# Patient Record
Sex: Male | Born: 1940 | Race: White | Hispanic: No | Marital: Single | Smoking: Never smoker
Health system: Southern US, Community
[De-identification: ages and names within clinical notes are randomized; demographics above are authoritative.]

## PROBLEM LIST (undated history)

## (undated) DIAGNOSIS — R011 Cardiac murmur, unspecified: Secondary | ICD-10-CM

## (undated) DIAGNOSIS — I509 Heart failure, unspecified: Secondary | ICD-10-CM

---

## 2017-05-21 ENCOUNTER — Emergency Department
Admission: EM | Admit: 2017-05-21 | Discharge: 2017-05-21 | Disposition: A | Discharge status: Skilled Nursing Facility | Payer: Medicare Other | Attending: Emergency Medicine | Admitting: Emergency Medicine

## 2017-05-21 ENCOUNTER — Encounter: Payer: Self-pay | Admitting: Emergency Medicine

## 2017-05-21 DIAGNOSIS — Z466 Encounter for fitting and adjustment of urinary device: Secondary | ICD-10-CM | POA: Diagnosis present

## 2017-05-21 DIAGNOSIS — Z960 Presence of urogenital implants: Secondary | ICD-10-CM | POA: Insufficient documentation

## 2017-05-21 DIAGNOSIS — R339 Retention of urine, unspecified: Secondary | ICD-10-CM | POA: Diagnosis not present

## 2017-05-21 HISTORY — DX: Heart failure, unspecified: I50.9

## 2017-05-21 HISTORY — DX: Cardiac murmur, unspecified: R01.1

## 2017-05-21 LAB — URINALYSIS, ROUTINE W REFLEX MICROSCOPIC
SQUAMOUS EPITHELIAL / LPF: NONE SEEN
Specific Gravity, Urine: 1.012 (ref 1.005–1.030)

## 2017-05-21 MED ORDER — CEPHALEXIN 250 MG PO CAPS: 250.0000 mg | ORAL_CAPSULE | Freq: Four times a day (QID) | ORAL | 0 refills | Status: AC

## 2017-05-21 NOTE — ED Triage Notes (Signed)
Patient is resident at [facility] and came via EMS d/t patient's foley sustaining trauma (nearly pulled out, then totally deflated and removed by staff RN there) around 11am today.  They were unable to replace catheter, and sought ED care.  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  Caregiver states there has been new pink bleeding since this event.

## 2017-05-21 NOTE — ED Provider Notes (Addendum)
[facility] Emergency Department Provider Note       Time seen: ----------------------------------------- 6:41 PM on 05/21/2017 -----------------------------------------   I have reviewed the triage vital signs and the nursing notes.   HISTORY   Chief Complaint Foley Catheter Displacement    HPI Carl Jackson is a 76 y.o. male who presents to the ED for Foley catheter dislodgment. Information is given through his sister. 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 Patient had inadvertently partially removed his Foley catheter today and a nurse subsequently removed it entirely. There was an attempt to replace the tube which was unsuccessful.   No past medical history on file.  There are no active problems to display for this patient.   No past surgical history on file.  Allergies Patient has no allergy information on record.  Social History Social History  Substance Use Topics  . Smoking status: Not on file  . Smokeless tobacco: Not on file  . Alcohol use Not on file    Review of Systems Constitutional: Negative for fever. Respiratory: Negative for shortness of breath. Gastrointestinal: Negative for vomiting and diarrhea. Genitourinary: Positive for urinary retention Skin: Negative for rash.  All systems negative/normal/unremarkable except as stated in the HPI  ____________________________________________   PHYSICAL EXAM:  VITAL SIGNS: ED Triage Vitals  Enc Vitals Group     BP      Pulse      Resp      Temp      Temp src      SpO2      Weight      Height      Head Circumference      Peak Flow      Pain Score      Pain Loc      Pain Edu?      Excl. in GC?     Constitutional: Well appearing and in no distress. Eyes: Conjunctivae are normal. Normal extraocular movements. Cardiovascular: Normal rate, regular rhythm. No murmurs, rubs, or gallops.Heart systolic  murmur noted Respiratory: Normal respiratory effort without tachypnea nor retractions. Breath sounds are clear and equal bilaterally. No wheezes/rales/rhonchi. Gastrointestinal: Soft and nontender. 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 Circumcised, scrotum is unremarkable Musculoskeletal: Nontender with normal range of motion in extremities. No lower extremity tenderness nor edema. Neurologic:  Normal speech and language. No gross focal neurologic deficits are appreciated.  Skin:  Skin is warm, dry and intact. No rash noted. ___________________________________________  ED COURSE:  Pertinent labs & imaging results that were available during my care of the patient were reviewed by me and considered in my medical decision making (see chart for details). Patient presents for Foley catheter dislodgment, we will attempt to replace Foley catheter and check a urinalysis. Clinical Course as of May 21 1954  Sat May 21, 2017  1956 16 French coud catheter was placed by me with some difficulty but with blood-tinged urine return. This was clearing at the time of discharge.  [JW]    Clinical Course User Index [JW] Emily Filbert, MD   Procedures ____________________________________________   LABS (pertinent positives/negatives)  Labs Reviewed  URINE CULTURE  URINALYSIS, ROUTINE W REFLEX MICROSCOPIC   ____________________________________________  FINAL ASSESSMENT AND PLAN  Foley catheter replacement  Plan: Patient's labs were dictated above. Patient had presented for Foley catheter replacement which was successful. I have seen urine culture, I will place him on preventative  antibiotics due to his history. He is stable for outpatient follow-up.   Emily FilbertWilliams, Carl Issa E, MD   Note: This note was generated in part or whole with voice recognition software. Voice recognition is usually quite accurate but there are transcription  errors that can and very often do occur. I apologize for any typographical errors that were not detected and corrected.     Emily FilbertWilliams, Carl Males E, MD 05/21/17 1956    Emily FilbertWilliams, Carl Madariaga E, MD 05/21/17 [phone]

## 2017-05-21 NOTE — ED Notes (Signed)
[facility] has been called to transport patient back to [facility] in [place].

## 2017-05-24 LAB — URINE CULTURE
Culture: >=100000 — AB
SPECIAL REQUESTS: NORMAL

## 2017-05-25 NOTE — Progress Notes (Signed)
[name] from [facility] seen in ED for foley catheter insertion after catheter was almost pulled by the patient at the assisted living facility then nursing staff deflated and attempted to reinsert without success. Patient was d/c from ED with prophylactic cephalexin. Urine culture was collected in the ED and subsequently resulted with E faecalis. Spoke to nurse at [facility] and faxed cutlure results. Nurse will f/u with Dr. Quillian QuinceBliss.   Carl Jackson, PharmD Clinical Pharmacist

## 2017-12-23 ENCOUNTER — Emergency Department: Payer: Medicare Other

## 2017-12-23 ENCOUNTER — Encounter: Payer: Self-pay | Admitting: Emergency Medicine

## 2017-12-23 ENCOUNTER — Other Ambulatory Visit: Payer: Self-pay

## 2017-12-23 ENCOUNTER — Emergency Department
Admission: EM | Admit: 2017-12-23 | Discharge: 2017-12-24 | Disposition: A | Discharge status: Skilled Nursing Facility | Payer: Medicare Other | Attending: Emergency Medicine | Admitting: Emergency Medicine

## 2017-12-23 DIAGNOSIS — I509 Heart failure, unspecified: Secondary | ICD-10-CM | POA: Insufficient documentation

## 2017-12-23 DIAGNOSIS — F79 Unspecified intellectual disabilities: Secondary | ICD-10-CM | POA: Diagnosis not present

## 2017-12-23 DIAGNOSIS — Y999 Unspecified external cause status: Secondary | ICD-10-CM | POA: Diagnosis not present

## 2017-12-23 DIAGNOSIS — Y939 Activity, unspecified: Secondary | ICD-10-CM | POA: Insufficient documentation

## 2017-12-23 DIAGNOSIS — T148XXA Other injury of unspecified body region, initial encounter: Secondary | ICD-10-CM

## 2017-12-23 DIAGNOSIS — S0083XA Contusion of other part of head, initial encounter: Secondary | ICD-10-CM | POA: Diagnosis not present

## 2017-12-23 DIAGNOSIS — Y92129 Unspecified place in nursing home as the place of occurrence of the external cause: Secondary | ICD-10-CM | POA: Diagnosis not present

## 2017-12-23 DIAGNOSIS — W19XXXA Unspecified fall, initial encounter: Secondary | ICD-10-CM | POA: Insufficient documentation

## 2017-12-23 DIAGNOSIS — S0990XA Unspecified injury of head, initial encounter: Secondary | ICD-10-CM | POA: Diagnosis present

## 2017-12-23 NOTE — ED Notes (Signed)
Pt's sister Avon GullyHCPOA [phone].

## 2017-12-23 NOTE — ED Provider Notes (Signed)
[facility] Emergency Department Provider Note  Time seen: 10:20 PM  I have reviewed the triage vital signs and the nursing notes.   HISTORY  Chief Complaint Fall    HPI Carl Jackson is a 77 y.o. male with a past medical history of CHF, mental retardation, lives at a nursing facility presents to the emergency department after a fall.  According to report patient had a fall at his nursing facility.  It was not reported that anyone witnessed the fall.  Patient was noted to have significant swelling and bruising above his left eye so he was transported to the emergency department for further evaluation.  Patient has a history of mental retardation, denies any pain, denies any complaints but largely cannot contribute to his history at this time.   Past Medical History:  Diagnosis Date  . CHF (congestive heart failure) (HCC)   . Heart murmur     There are no active problems to display for this patient.   History reviewed. No pertinent surgical history.  Prior to Admission medications   Not on File    No Known Allergies  No family history on file.  Social History Social History   Tobacco Use  . Smoking status: Never Smoker  . Smokeless tobacco: Never Used  Substance Use Topics  . Alcohol use: No  . Drug use: No    Review of Systems Unable to obtain an adequate/accurate review of systems due to mental retardation.  ____________________________________________   PHYSICAL EXAM:  VITAL SIGNS: ED Triage Vitals  Enc Vitals Group     BP 12/23/17 2154 140/86     Pulse Rate 12/23/17 2154 93     Resp 12/23/17 2154 (!) 24     Temp 12/23/17 2154 98.4 F (36.9 C)     Temp Source 12/23/17 2154 Oral     SpO2 12/23/17 2154 99 %     Weight 12/23/17 2158 120 lb (54.4 kg)     Height 12/23/17 2158 5\' 6"  (1.676 m)     Head Circumference --      Peak Flow --      Pain Score --      Pain Loc --      Pain Edu? --      Excl. in GC? --     Constitutional: Alert, no distress, calm.  Follows commands.  Answers some questions but with questionable accuracy. Eyes: Moderate hematoma with ecchymosis to the left eyebrow with periorbital edema and ecchymosis unable to open the left eye without manually lifting the lid. ENT   Head: Patient has a moderate hematoma with ecchymosis above the left eye, mild ecchymosis above the right eye.  I was able to manually lift the left eyelid, extraocular's are intact, pupil appears responsive.   Nose: No blood in the nostrils or septal hematoma   Mouth/Throat: Mucous membranes are moist. Cardiovascular: Normal rate, regular rhythm Respiratory: Normal respiratory effort without tachypnea nor retractions. Breath sounds are clear  Gastrointestinal: Soft and nontender. No distention.   Musculoskeletal: Nontender with normal range of motion in all extremities.  Good range of motion in all extremities.  Mild abrasion to right knee Neurologic:  Normal speech and language. No gross focal neurologic deficits  Skin:  Skin is warm.  Mild abrasion to the lateral aspect of the right knee Psychiatric: Mood and affect are normal.   ____________________________________________    EKG  EKG reviewed and interpreted by myself shows sinus rhythm at 95 bpm with a  slightly widened QRS, normal axis, nonspecific ST changes.  Occasional PVCs.  ____________________________________________    RADIOLOGY  CT scans are negative for fracture, intracranial abnormality or cervical spine fracture.  I do note possible interstitial edema patient has no complaints of shortness of breath, satting 99% on room air.  ____________________________________________   INITIAL IMPRESSION / ASSESSMENT AND PLAN / ED COURSE  Pertinent labs & imaging results that were available during my care of the patient were reviewed by me and considered in my medical decision making (see chart for details).  Patient presents to the  emergency department from his nursing facility after a fall.  He does have a hematoma above his left eye with mild bruising of the right eye.  Extraocular does appear intact.  Overall the patient appears well, no distress.  History of MR but appears calm, cooperative and pleasant.  We will obtain CT imaging of the head face and C-spine as a precaution.  On MAR the patient does not appear to be on any anticoagulation, besides an 81 mg aspirin daily.  Patient has a DNR form.  CT scans are largely negative besides soft tissue swelling/hematoma.  Patient will be discharged home.  ____________________________________________   FINAL CLINICAL IMPRESSION(S) / ED DIAGNOSES  Fall Hematoma    Minna Antis, MD 12/23/17 [phone]

## 2017-12-23 NOTE — ED Triage Notes (Signed)
Pt arrives via [facility] with c/o fall. Pt arrives from [facility] and has has contusion noted to both eyes. Pt takes baby aspirin but is otherwise not on anticoagulants. Pt has hx of MR but denies LOC. No other bruising noted at this time. Pt is in NAD.

## 2017-12-23 NOTE — ED Notes (Signed)
Pt given yellow socks and fall pads as well as a bed alarm.

## 2017-12-23 NOTE — ED Notes (Signed)
[facility] called for transport to [facility]

## 2017-12-24 ENCOUNTER — Observation Stay
Admission: EM | Admit: 2017-12-24 | Discharge: 2017-12-25 | Disposition: A | Discharge status: Home / Self Care | Payer: Medicare Other | Attending: Internal Medicine | Admitting: Internal Medicine

## 2017-12-24 ENCOUNTER — Encounter: Payer: Self-pay | Admitting: Emergency Medicine

## 2017-12-24 ENCOUNTER — Other Ambulatory Visit: Payer: Self-pay

## 2017-12-24 ENCOUNTER — Emergency Department: Payer: Medicare Other

## 2017-12-24 DIAGNOSIS — F79 Unspecified intellectual disabilities: Secondary | ICD-10-CM | POA: Insufficient documentation

## 2017-12-24 DIAGNOSIS — N400 Enlarged prostate without lower urinary tract symptoms: Secondary | ICD-10-CM | POA: Diagnosis not present

## 2017-12-24 DIAGNOSIS — I509 Heart failure, unspecified: Secondary | ICD-10-CM | POA: Diagnosis not present

## 2017-12-24 DIAGNOSIS — R748 Abnormal levels of other serum enzymes: Secondary | ICD-10-CM | POA: Diagnosis present

## 2017-12-24 DIAGNOSIS — I110 Hypertensive heart disease with heart failure: Secondary | ICD-10-CM | POA: Diagnosis not present

## 2017-12-24 DIAGNOSIS — R55 Syncope and collapse: Principal | ICD-10-CM | POA: Insufficient documentation

## 2017-12-24 DIAGNOSIS — Z7982 Long term (current) use of aspirin: Secondary | ICD-10-CM | POA: Insufficient documentation

## 2017-12-24 DIAGNOSIS — R778 Other specified abnormalities of plasma proteins: Secondary | ICD-10-CM | POA: Diagnosis present

## 2017-12-24 DIAGNOSIS — Z79899 Other long term (current) drug therapy: Secondary | ICD-10-CM | POA: Diagnosis not present

## 2017-12-24 DIAGNOSIS — Z66 Do not resuscitate: Secondary | ICD-10-CM | POA: Insufficient documentation

## 2017-12-24 DIAGNOSIS — R7989 Other specified abnormal findings of blood chemistry: Secondary | ICD-10-CM

## 2017-12-24 DIAGNOSIS — W19XXXA Unspecified fall, initial encounter: Secondary | ICD-10-CM

## 2017-12-24 LAB — BASIC METABOLIC PANEL
Anion gap: 10 (ref 5–15)
BUN: 23 mg/dL — AB (ref 6–20)
CALCIUM: 9.5 mg/dL (ref 8.9–10.3)
CHLORIDE: 103 mmol/L (ref 101–111)
CO2: 28 mmol/L (ref 22–32)
CREATININE: 1.13 mg/dL (ref 0.61–1.24)
GFR calc Af Amer: >60 mL/min (ref >60)
Glucose, Bld: 150 mg/dL — ABNORMAL HIGH (ref 65–99)
Potassium: 3.9 mmol/L (ref 3.5–5.1)
SODIUM: 141 mmol/L (ref 135–145)

## 2017-12-24 LAB — CBC WITH DIFFERENTIAL/PLATELET
BASOS PCT: 1 %
Basophils Absolute: 0.1 10*3/uL (ref 0–0.1)
EOS ABS: 0.1 10*3/uL (ref 0–0.7)
EOS PCT: 1 %
HCT: 40.6 % (ref 40.0–52.0)
HEMOGLOBIN: 13.7 g/dL (ref 13.0–18.0)
LYMPHS ABS: 0.9 10*3/uL — AB (ref 1.0–3.6)
Lymphocytes Relative: 14 %
MCH: 30.3 pg (ref 26.0–34.0)
MCHC: 33.7 g/dL (ref 32.0–36.0)
MCV: 89.7 fL (ref 80.0–100.0)
MONO ABS: 0.7 10*3/uL (ref 0.2–1.0)
MONOS PCT: 10 %
NEUTROS PCT: 74 %
Neutro Abs: 5.2 10*3/uL (ref 1.4–6.5)
PLATELETS: 278 10*3/uL (ref 150–440)
RBC: 4.52 MIL/uL (ref 4.40–5.90)
RDW: 13.1 % (ref 11.5–14.5)
WBC: 7 10*3/uL (ref 3.8–10.6)

## 2017-12-24 LAB — TROPONIN I
TROPONIN I: 0.18 ng/mL — AB (ref <0.03)
TROPONIN I: 0.18 ng/mL — AB (ref <0.03)
TROPONIN I: 0.21 ng/mL — AB (ref <0.03)
Troponin I: 0.21 ng/mL (ref <0.03)

## 2017-12-24 LAB — CK: CK TOTAL: 128 U/L (ref 49–397)

## 2017-12-24 MED ORDER — ENOXAPARIN SODIUM 40 MG/0.4ML ~~LOC~~ SOLN
40.0000 mg | SUBCUTANEOUS | Status: DC
  Administered 2017-12-24: 40 mg via SUBCUTANEOUS
  Filled 2017-12-24: qty 0.4

## 2017-12-24 MED ORDER — ONDANSETRON HCL 4 MG/2ML IJ SOLN: 4.0000 mg | Freq: Four times a day (QID) | INTRAMUSCULAR | Status: DC | PRN

## 2017-12-24 MED ORDER — TAMSULOSIN HCL 0.4 MG PO CAPS
0.8000 mg | ORAL_CAPSULE | Freq: Every day | ORAL | Status: DC
  Administered 2017-12-24: 0.8 mg via ORAL
  Filled 2017-12-24: qty 2

## 2017-12-24 MED ORDER — ASPIRIN EC 81 MG PO TBEC
81.0000 mg | DELAYED_RELEASE_TABLET | Freq: Every day | ORAL | Status: DC
  Administered 2017-12-24 – 2017-12-25 (×2): 81 mg via ORAL
  Filled 2017-12-24 (×2): qty 1

## 2017-12-24 MED ORDER — ACETAMINOPHEN 650 MG RE SUPP: 650.0000 mg | Freq: Four times a day (QID) | RECTAL | Status: DC | PRN

## 2017-12-24 MED ORDER — ONDANSETRON HCL 4 MG PO TABS: 4.0000 mg | ORAL_TABLET | Freq: Four times a day (QID) | ORAL | Status: DC | PRN

## 2017-12-24 MED ORDER — ACETAMINOPHEN 325 MG PO TABS: 650.0000 mg | ORAL_TABLET | Freq: Four times a day (QID) | ORAL | Status: DC | PRN

## 2017-12-24 MED ORDER — FUROSEMIDE 40 MG PO TABS
40.0000 mg | ORAL_TABLET | Freq: Every day | ORAL | Status: DC
  Administered 2017-12-24 – 2017-12-25 (×2): 40 mg via ORAL
  Filled 2017-12-24 (×2): qty 1

## 2017-12-24 MED ORDER — FINASTERIDE 5 MG PO TABS
5.0000 mg | ORAL_TABLET | Freq: Every day | ORAL | Status: DC
  Administered 2017-12-24 – 2017-12-25 (×2): 5 mg via ORAL
  Filled 2017-12-24 (×2): qty 1

## 2017-12-24 MED ORDER — VITAMIN D 1000 UNITS PO TABS
ORAL_TABLET | Freq: Every day | ORAL | Status: DC
  Administered 2017-12-24 – 2017-12-25 (×2): 1000 [IU] via ORAL
  Filled 2017-12-24 (×2): qty 1

## 2017-12-24 MED ORDER — METOPROLOL SUCCINATE ER 25 MG PO TB24
12.5000 mg | ORAL_TABLET | Freq: Every day | ORAL | Status: DC
  Administered 2017-12-24 – 2017-12-25 (×2): 12.5 mg via ORAL
  Filled 2017-12-24 (×2): qty 1

## 2017-12-24 NOTE — H&P (Signed)
[facility] - [place] [facility]   PATIENT NAME: Carl Jackson    MR#:  [id number]  DATE OF BIRTH:  [birth year]  DATE OF ADMISSION:  12/24/2017  PRIMARY CARE PHYSICIAN: Dortha Kern, MD   REQUESTING/REFERRING PHYSICIAN: Dr. Phineas Semen  CHIEF COMPLAINT:   Chief Complaint  Patient presents with  . Fall    HISTORY OF PRESENT ILLNESS:  Carl Jackson  is a 77 y.o. male with a known history of mental retardation, history of CHF, BPH, essential hypertension who presents to the hospital due to a fall/syncope and noted to have an elevated troponin. Patient was in the emergency room yesterday after unwitnessed fall and noted to have a negative CT head with a left periorbital/scalp hematoma. He went back to his assisted living and returns today as he was sitting in his wheelchair and was found to be somewhat unresponsive and suspected to have some seizure type activity. Patient presented to the ER underwent a repeat CT head which was again negative for acute pathology. His troponins incidentally was noted to be elevated 0.18. He has no chest pain and is a very poor historian therefore most of the history obtained from the ER physician and also the chart.  PAST MEDICAL HISTORY:   Past Medical History:  Diagnosis Date  . CHF (congestive heart failure) (HCC)   . Heart murmur     PAST SURGICAL HISTORY:  History reviewed. No pertinent surgical history.  SOCIAL HISTORY:   Social History   Tobacco Use  . Smoking status: Never Smoker  . Smokeless tobacco: Never Used  Substance Use Topics  . Alcohol use: No    FAMILY HISTORY:  No family history on file.  DRUG ALLERGIES:  No Known Allergies  REVIEW OF SYSTEMS:   Review of Systems  Unable to perform ROS: Mental acuity    MEDICATIONS AT HOME:   Prior to Admission medications   Medication Sig Start Date End Date Taking? Authorizing Provider  aspirin EC 81 MG tablet Take 81 mg by mouth daily.   Yes  [provider]  Cholecalciferol (D3-1000) 1000 units tablet Take 1,000 mg by mouth daily.   Yes [provider]  finasteride (PROSCAR) 5 MG tablet Take 5 mg by mouth daily. 12/23/17  Yes [provider]  furosemide (LASIX) 40 MG tablet Take 40 mg by mouth daily. 12/15/17  Yes [provider]  loperamide (IMODIUM) 2 MG capsule Take 2 mg by mouth as needed for diarrhea or loose stools.   Yes [provider]  magnesium oxide (MAG-OX) 400 MG tablet Take 800 mg by mouth daily with lunch. 05/11/17 05/11/18 Yes [provider]  metoprolol succinate (TOPROL-XL) 25 MG 24 hr tablet Take 12.5 mg by mouth daily. 12/13/17  Yes [provider]  tamsulosin (FLOMAX) 0.4 MG CAPS capsule Take 0.8 mg by mouth at bedtime. 11/30/17  Yes [provider]      VITAL SIGNS:  Blood pressure 109/64, pulse 77, temperature 97.7 F (36.5 C), temperature source Oral, resp. rate (!) 26, height 5\' 6"  (1.676 m), weight 54.4 kg (120 lb), SpO2 98 %.  PHYSICAL EXAMINATION:  Physical Exam  GENERAL:  77 y.o.-year-old patient lying in the bed in no acute distress.  EYES: Pupils equal, round, reactive to light and accommodation. No scleral icterus. Extraocular muscles intact. Left periorbital hematoma with bruising.  HEENT: Head atraumatic, normocephalic. Oropharynx and nasopharynx clear. No oropharyngeal erythema, moist oral mucosa  NECK:  Supple, no jugular venous distention.  No thyroid enlargement, no tenderness.  LUNGS: Normal breath sounds bilaterally, no wheezing, rales, rhonchi. No use of accessory muscles of respiration.  CARDIOVASCULAR: S1, S2 RRR. II/VI SEM at base, No rubs, gallops, clicks.  ABDOMEN: Soft, nontender, nondistended. Bowel sounds present. No organomegaly or mass.  EXTREMITIES: No pedal edema, cyanosis, or clubbing. + 2 pedal & radial pulses b/l.   NEUROLOGIC: Cranial nerves II through XII are intact. No focal Motor or sensory deficits  appreciated b/l. Globally weak.  PSYCHIATRIC: The patient is alert and oriented x 1.   SKIN: No obvious rash, lesion, or ulcer.   LABORATORY PANEL:   CBC Recent Labs  Lab 12/24/17 1119  WBC 7.0  HGB 13.7  HCT 40.6  PLT 278   ------------------------------------------------------------------------------------------------------------------  Chemistries  Recent Labs  Lab 12/24/17 1119  NA 141  K 3.9  CL 103  CO2 28  GLUCOSE 150*  BUN 23*  CREATININE 1.13  CALCIUM 9.5   ------------------------------------------------------------------------------------------------------------------  Cardiac Enzymes Recent Labs  Lab 12/24/17 1119  TROPONINI 0.18*   ------------------------------------------------------------------------------------------------------------------  RADIOLOGY:  Ct Head Wo Contrast  Result Date: 12/24/2017 CLINICAL DATA:  Left orbital bruising and swelling after fall. EXAM: CT HEAD WITHOUT CONTRAST TECHNIQUE: Contiguous axial images were obtained from the base of the skull through the vertex without intravenous contrast. COMPARISON:  December 23, 2017 FINDINGS: Brain: No subdural, epidural, or subarachnoid hemorrhage. Ventricles and sulci are prominent but stable. Cerebellum, brainstem, and basal cisterns are normal. No mass effect or midline shift. No acute cortical ischemia or infarct. Vascular: No hyperdense vessel or unexpected calcification. Skull: Normal. Negative for fracture or focal lesion. Sinuses/Orbits: Mucosal thickening involves the ethmoid sinuses. No air-fluid levels. Mastoid air cells and middle ears are well aerated. Other: There is a small hematoma in the left periorbital region. The underlying globe is not completely imaged but grossly unremarkable. IMPRESSION: 1. No acute intracranial abnormality. 2. Small hematoma in the soft tissues over the left eye. The underlying globe is not completely imaged but there are no abnormalities in the underlying  globe within visualize limits. Electronically Signed   By: Gerome Sam III M.D   On: 12/24/2017 12:27   Ct Head Wo Contrast  Result Date: 12/23/2017 CLINICAL DATA:  Contusion about both eyes status post fall. EXAM: CT HEAD WITHOUT CONTRAST CT MAXILLOFACIAL WITHOUT CONTRAST CT CERVICAL SPINE WITHOUT CONTRAST TECHNIQUE: Multidetector CT imaging of the head, cervical spine, and maxillofacial structures were performed using the standard protocol without intravenous contrast. Multiplanar CT image reconstructions of the cervical spine and maxillofacial structures were also generated. COMPARISON:  None. FINDINGS: CT HEAD FINDINGS BRAIN: There is mild sulcal and mild to moderate ventricular prominence consistent with superficial and central atrophy. No intraparenchymal hemorrhage, mass effect nor midline shift. Periventricular and subcortical white matter hypodensities consistent with chronic small vessel ischemic disease are identified. No acute large vascular territory infarcts. No abnormal extra-axial fluid collections. Basal cisterns are not effaced and midline. VASCULAR: Mild calcific atherosclerosis of the carotid siphons. No hyperdense vessels. SKULL: No skull fracture. No significant scalp soft tissue swelling. SINUSES/ORBITS: The mastoid air-cells are clear. The included paranasal sinuses are well-aerated.Moderate left supraorbital and periorbital soft tissue swelling is seen. No retrobulbar hemorrhage or hematoma. OTHER: None. CT MAXILLOFACIAL FINDINGS Osseous:   No acute maxillofacial fracture. Orbits: Intact orbits and globes. No retrobulbar hemorrhage or hematoma. Sinuses: Ethmoid and frontal sinus mucosal thickening. Soft tissues: Left greater than right supraorbital soft tissue swelling. CT CERVICAL SPINE FINDINGS Alignment: Normal. Skull base and  vertebrae: No acute fracture. No primary bone lesion or focal pathologic process. Soft tissues and spinal canal: No prevertebral fluid or swelling. No  visible canal hematoma. Disc levels: No significant disc space narrowing. Mild central disc bulges C2 through C7. No focal disc herniation, significant central canal stenosis or significant neural foraminal encroachment. No jumped or perched facets. Upper chest: Mild interstitial thickening suspicious for interstitial edema. Other: Bilateral mild extracranial carotid arteriosclerosis. IMPRESSION: 1. Atrophy with chronic small vessel ischemic disease. No acute intracranial abnormality. 2. Supraorbital soft tissue swelling left greater than right without acute maxillofacial fracture. 3. No acute cervical spine fracture or posttraumatic listhesis. 4. Mild interstitial prominence suspicious for interstitial edema. Electronically Signed   By: Tollie Eth M.D.   On: 12/23/2017 23:25   Ct Cervical Spine Wo Contrast  Result Date: 12/23/2017 CLINICAL DATA:  Contusion about both eyes status post fall. EXAM: CT HEAD WITHOUT CONTRAST CT MAXILLOFACIAL WITHOUT CONTRAST CT CERVICAL SPINE WITHOUT CONTRAST TECHNIQUE: Multidetector CT imaging of the head, cervical spine, and maxillofacial structures were performed using the standard protocol without intravenous contrast. Multiplanar CT image reconstructions of the cervical spine and maxillofacial structures were also generated. COMPARISON:  None. FINDINGS: CT HEAD FINDINGS BRAIN: There is mild sulcal and mild to moderate ventricular prominence consistent with superficial and central atrophy. No intraparenchymal hemorrhage, mass effect nor midline shift. Periventricular and subcortical white matter hypodensities consistent with chronic small vessel ischemic disease are identified. No acute large vascular territory infarcts. No abnormal extra-axial fluid collections. Basal cisterns are not effaced and midline. VASCULAR: Mild calcific atherosclerosis of the carotid siphons. No hyperdense vessels. SKULL: No skull fracture. No significant scalp soft tissue swelling. SINUSES/ORBITS:  The mastoid air-cells are clear. The included paranasal sinuses are well-aerated.Moderate left supraorbital and periorbital soft tissue swelling is seen. No retrobulbar hemorrhage or hematoma. OTHER: None. CT MAXILLOFACIAL FINDINGS Osseous:   No acute maxillofacial fracture. Orbits: Intact orbits and globes. No retrobulbar hemorrhage or hematoma. Sinuses: Ethmoid and frontal sinus mucosal thickening. Soft tissues: Left greater than right supraorbital soft tissue swelling. CT CERVICAL SPINE FINDINGS Alignment: Normal. Skull base and vertebrae: No acute fracture. No primary bone lesion or focal pathologic process. Soft tissues and spinal canal: No prevertebral fluid or swelling. No visible canal hematoma. Disc levels: No significant disc space narrowing. Mild central disc bulges C2 through C7. No focal disc herniation, significant central canal stenosis or significant neural foraminal encroachment. No jumped or perched facets. Upper chest: Mild interstitial thickening suspicious for interstitial edema. Other: Bilateral mild extracranial carotid arteriosclerosis. IMPRESSION: 1. Atrophy with chronic small vessel ischemic disease. No acute intracranial abnormality. 2. Supraorbital soft tissue swelling left greater than right without acute maxillofacial fracture. 3. No acute cervical spine fracture or posttraumatic listhesis. 4. Mild interstitial prominence suspicious for interstitial edema. Electronically Signed   By: Tollie Eth M.D.   On: 12/23/2017 23:25   Ct Maxillofacial Wo Contrast  Result Date: 12/23/2017 CLINICAL DATA:  Contusion about both eyes status post fall. EXAM: CT HEAD WITHOUT CONTRAST CT MAXILLOFACIAL WITHOUT CONTRAST CT CERVICAL SPINE WITHOUT CONTRAST TECHNIQUE: Multidetector CT imaging of the head, cervical spine, and maxillofacial structures were performed using the standard protocol without intravenous contrast. Multiplanar CT image reconstructions of the cervical spine and maxillofacial  structures were also generated. COMPARISON:  None. FINDINGS: CT HEAD FINDINGS BRAIN: There is mild sulcal and mild to moderate ventricular prominence consistent with superficial and central atrophy. No intraparenchymal hemorrhage, mass effect nor midline shift. Periventricular and subcortical white matter hypodensities  consistent with chronic small vessel ischemic disease are identified. No acute large vascular territory infarcts. No abnormal extra-axial fluid collections. Basal cisterns are not effaced and midline. VASCULAR: Mild calcific atherosclerosis of the carotid siphons. No hyperdense vessels. SKULL: No skull fracture. No significant scalp soft tissue swelling. SINUSES/ORBITS: The mastoid air-cells are clear. The included paranasal sinuses are well-aerated.Moderate left supraorbital and periorbital soft tissue swelling is seen. No retrobulbar hemorrhage or hematoma. OTHER: None. CT MAXILLOFACIAL FINDINGS Osseous:   No acute maxillofacial fracture. Orbits: Intact orbits and globes. No retrobulbar hemorrhage or hematoma. Sinuses: Ethmoid and frontal sinus mucosal thickening. Soft tissues: Left greater than right supraorbital soft tissue swelling. CT CERVICAL SPINE FINDINGS Alignment: Normal. Skull base and vertebrae: No acute fracture. No primary bone lesion or focal pathologic process. Soft tissues and spinal canal: No prevertebral fluid or swelling. No visible canal hematoma. Disc levels: No significant disc space narrowing. Mild central disc bulges C2 through C7. No focal disc herniation, significant central canal stenosis or significant neural foraminal encroachment. No jumped or perched facets. Upper chest: Mild interstitial thickening suspicious for interstitial edema. Other: Bilateral mild extracranial carotid arteriosclerosis. IMPRESSION: 1. Atrophy with chronic small vessel ischemic disease. No acute intracranial abnormality. 2. Supraorbital soft tissue swelling left greater than right without acute  maxillofacial fracture. 3. No acute cervical spine fracture or posttraumatic listhesis. 4. Mild interstitial prominence suspicious for interstitial edema. Electronically Signed   By: Tollie Ethavid  Kwon M.D.   On: 12/23/2017 23:25     IMPRESSION AND PLAN:   77 year old male with past medical history of mental retardation, hypertension, BPH who presents to the hospital after a syncopal episode/fall and noted to have a elevated troponin.  1. Syncope/seizure-it's unclear what exactly happened. Patient presented to the hospital yesterday with a unwitnessed fall and had negative trauma workup. He was sent back to his assisted living and now presents today with a suspected syncopal episode and possible seizure. He has had no seizure type activity while in the ER. -Repeat CT head is negative for acute pathology. -I will observe him on telemetry, he is a mildly elevated troponin and we'll cycle his markers. Check Echocardiogram.  2. Elevated troponin-I suspect this is supply demand ischemia from his recent traumatic fall. -We'll observe on telemetry, cycle his markers. Check a two-dimensional echocardiogram.  3. Status post fall/syncope-we will get a physical therapy consult to assess his mobility.  4. Essential hypertension-continue Toprol.  5. BPH-no urinary retention. Continue finasteride, Flomax.  6. History of CHF-clinically patient is not in congestive heart failure. Continue Lasix, Toprol.    All the records are reviewed and case discussed with ED provider. Management plans discussed with the patient, family and they are in agreement.  CODE STATUS: DO NOT RESUSCITATE  TOTAL TIME TAKING CARE OF THIS PATIENT: 40 minutes.    Houston SirenSAINANI,Yer Olivencia J M.D on 12/24/2017 at 1:42 PM  Between 7am to 6pm - Pager - [phone]  After 6pm go to [email or web address] - password [email or web address] [facility]  [facility]  Office  [phone]  CC: Primary care physician; Dortha KernBliss, Laura K, MD

## 2017-12-24 NOTE — Progress Notes (Signed)
CRITICAL VALUE ALERT  Critical Value:  Trop 0.21  Date & Time Notied:  12/24/2017 4:09 PM  Provider Notified: Dr Cherlynn KaiserSainani

## 2017-12-24 NOTE — ED Notes (Signed)
Staff [facility] that pt either had a seizure or passed out and fell out of his wheelchair. Pt is altered at baseline.

## 2017-12-24 NOTE — ED Triage Notes (Signed)
Ems pt from [facility], ems reported that the staff had two stories, " one was the pt was in the wheelchair and had a seizure and fell out of the wheelchair, another one is he passed out and fell out of the wheelchair", pt was seen here for a fall x1 day ago , pt arrived with left orbit  bruising and swelling, pts basline is confusion, (DNR order with pt) , ems reported irregular hearty rhythms during transport.

## 2017-12-24 NOTE — Plan of Care (Signed)
  Progressing Clinical Measurements: Diagnostic test results will improve 12/24/2017 2340 - Progressing by Dorna LeitzNesbitt, Amaar Oshita M, RN Pain Managment: General experience of comfort will improve 12/24/2017 2340 - Progressing by Dorna LeitzNesbitt, Kha Hari M, RN Safety: Ability to remain free from injury will improve 12/24/2017 2340 - Progressing by Dorna LeitzNesbitt, Klinton Candelas M, RN Skin Integrity: Risk for impaired skin integrity will decrease 12/24/2017 2340 - Progressing by Dorna LeitzNesbitt, Ruthanna Macchia M, RN

## 2017-12-24 NOTE — ED Provider Notes (Signed)
[facility] Emergency Department Provider Note  ____________________________________________   I have reviewed the triage vital signs and the nursing notes.   HISTORY  Chief Complaint Fall   History limited by and level 5 caveat due to mental retardation.   HPI Carl Jackson is a 77 y.o. male who presents to the emergency department today after fall from living facility. It is unclear if the patient passed out and had seizure like activity, or had a seizure. However it does sound like he was in his wheelchair at that time. Per chart review the patient was seen yesterday for a fall with negative imaging. Did suffer from left orbital hematoma at that time. The patient himself does not know why he is here and cannot give any significant history.    Per medical record review patient has a history of CHR, heart murmur.   Past Medical History:  Diagnosis Date  . CHF (congestive heart failure) (HCC)   . Heart murmur     There are no active problems to display for this patient.   History reviewed. No pertinent surgical history.  Prior to Admission medications   Not on File    Allergies Patient has no known allergies.  No family history on file.  Social History Social History   Tobacco Use  . Smoking status: Never Smoker  . Smokeless tobacco: Never Used  Substance Use Topics  . Alcohol use: No  . Drug use: No    Review of Systems Constitutional: No fever/chills Eyes: No visual changes. ENT: No sore throat. Cardiovascular: Denies chest pain. Respiratory: Denies shortness of breath. Gastrointestinal: No abdominal pain.  No nausea, no vomiting.  No diarrhea.   Genitourinary: Negative for dysuria. Musculoskeletal: Negative for back pain. Skin: Negative for rash. Neurological: Negative for headaches, focal weakness or numbness.  ____________________________________________   PHYSICAL EXAM:  VITAL SIGNS: ED Triage Vitals  Enc Vitals  Group     BP 12/24/17 1035 92/76     Pulse Rate 12/24/17 1035 77     Resp 12/24/17 1035 20     Temp 12/24/17 1035 97.7 F (36.5 C)     Temp Source 12/24/17 1035 Oral     SpO2 12/24/17 1033 98 %     Weight 12/24/17 1036 120 lb (54.4 kg)     Height 12/24/17 1036 5\' 6"  (1.676 m)   Constitutional: Awake and alert. Not oriented to events.  Eyes: Conjunctivae are normal.  ENT   Head: Normocephalic. Hematoma and bruising around the left eye.    Nose: No congestion/rhinnorhea.   Mouth/Throat: Mucous membranes are moist.   Neck: No stridor. Hematological/Lymphatic/Immunilogical: No cervical lymphadenopathy. Cardiovascular: Normal rate, regular rhythm.  No murmurs, rubs, or gallops. Respiratory: Normal respiratory effort without tachypnea nor retractions. Breath sounds are clear and equal bilaterally. No wheezes/rales/rhonchi. Gastrointestinal: Soft and non tender. No rebound. No guarding.  Genitourinary: Deferred Musculoskeletal: Normal range of motion in all extremities. No lower extremity edema. Neurologic:  Normal speech and language. No gross focal neurologic deficits are appreciated.  Skin:  Skin is warm, dry and intact. No rash noted. Psychiatric: Mood and affect are normal. Speech and behavior are normal. Patient exhibits appropriate insight and judgment.  ____________________________________________    LABS (pertinent positives/negatives)  Trop 0.18 BMP glu 150, cr 1.13, k 3.9 CBC wnl except lymph # 0.9  ____________________________________________   EKG  I, Phineas Semen, attending physician, personally viewed and interpreted this EKG  EKG Time: 1026 Rate: 82 Rhythm: sinus rhythm with  1st degree av block Axis: right axis deviation Intervals: qtc 534, 1st degree av block QRS: RBBB ST changes: no st elevation Impression: abnormal ekg   ____________________________________________    RADIOLOGY  CT head No acute intracranial  process  ____________________________________________   PROCEDURES  Procedures  CRITICAL CARE Performed by: Phineas SemenGOODMAN, Jeran Hiltz   Total critical care time: 30 minutes  Critical care time was exclusive of separately billable procedures and treating other patients.  Critical care was necessary to treat or prevent imminent or life-threatening deterioration.  Critical care was time spent personally by me on the following activities: development of treatment plan with patient and/or surrogate as well as nursing, discussions with consultants, evaluation of patient's response to treatment, examination of patient, obtaining history from patient or surrogate, ordering and performing treatments and interventions, ordering and review of laboratory studies, ordering and review of radiographic studies, pulse oximetry and re-evaluation of patient's condition.  ____________________________________________   INITIAL IMPRESSION / ASSESSMENT AND PLAN / ED COURSE  Pertinent labs & imaging results that were available during my care of the patient were reviewed by me and considered in my medical decision making (see chart for details).  She presented to the emergency department today after what sounds like a syncopal episode and a fall.  I think if there was any seizure-like activity was probably related to the syncopal event.  Patient was also seen yesterday for fall.  Given syncope today blood work was obtained.  Differential would include anemia, electrolyte abnormality, cardiac etiology.  Troponin was elevated to 0.18.  I do not see any previous troponins in our system.  Patient however is not complaining of any chest pain and EKG is not consistent with a STEMI.  Given elevated troponin will plan on admission to the hospitalist service.  I did try to get in contact with the sister and on her cell phone and evening found however no answer at  either.   ____________________________________________   FINAL CLINICAL IMPRESSION(S) / ED DIAGNOSES  Final diagnoses:  Fall, initial encounter  Syncope, unspecified syncope type  Elevated troponin     Note: This dictation was prepared with Dragon dictation. Any transcriptional errors that result from this process are unintentional     Phineas SemenGoodman, Manmeet Arzola, MD 12/24/17 1313

## 2017-12-24 NOTE — Progress Notes (Signed)
Pt received from ED. VSS, heart monitor placed. Admission profile unable to be completed d/t AMS, which is pt's baseline. Oriented pt to call system. Will continue to monitor.

## 2017-12-24 NOTE — ED Notes (Signed)
Pt unable to sign e-signature pad.

## 2017-12-25 ENCOUNTER — Observation Stay: Admit: 2017-12-25 | Payer: Medicare Other

## 2017-12-25 DIAGNOSIS — R55 Syncope and collapse: Secondary | ICD-10-CM | POA: Diagnosis not present

## 2017-12-25 LAB — BASIC METABOLIC PANEL
ANION GAP: 7 (ref 5–15)
BUN: 24 mg/dL — ABNORMAL HIGH (ref 6–20)
CALCIUM: 9.3 mg/dL (ref 8.9–10.3)
CO2: 28 mmol/L (ref 22–32)
Chloride: 107 mmol/L (ref 101–111)
Creatinine, Ser: 1.26 mg/dL — ABNORMAL HIGH (ref 0.61–1.24)
GFR, EST NON AFRICAN AMERICAN: 53 mL/min — AB (ref >60)
Glucose, Bld: 101 mg/dL — ABNORMAL HIGH (ref 65–99)
POTASSIUM: 3.7 mmol/L (ref 3.5–5.1)
SODIUM: 142 mmol/L (ref 135–145)

## 2017-12-25 LAB — CBC
HEMATOCRIT: 37.6 % — AB (ref 40.0–52.0)
HEMOGLOBIN: 12.9 g/dL — AB (ref 13.0–18.0)
MCH: 30.5 pg (ref 26.0–34.0)
MCHC: 34.3 g/dL (ref 32.0–36.0)
MCV: 88.8 fL (ref 80.0–100.0)
Platelets: 238 10*3/uL (ref 150–440)
RBC: 4.24 MIL/uL — ABNORMAL LOW (ref 4.40–5.90)
RDW: 13.1 % (ref 11.5–14.5)
WBC: 6.7 10*3/uL (ref 3.8–10.6)

## 2017-12-25 NOTE — Progress Notes (Signed)
PT Cancellation Note  Patient Details Name: Carl OverlieHarold Schaus MRN: [id number] DOB: 12-24-1940   Cancelled Treatment:    Reason Eval/Treat Not Completed: Other (comment) .  Pt will not need evaluation per nsg as he is already slated to return to SNF and has no need to be seen by PT until return there.     Ivar DrapeRuth E Alizabeth Antonio 12/25/2017, 12:42 PM   Samul Dadauth Daivion Pape, PT MS Acute Rehab Dept. Number: [facility] [id number] and [facility] [phone]

## 2017-12-25 NOTE — Discharge Summary (Signed)
[facility] [place] at [facility]   PATIENT NAME: Carl Jackson    MR#:  [id number]  DATE OF BIRTH:  Feb 18, 1941  DATE OF ADMISSION:  12/24/2017 ADMITTING PHYSICIAN: Houston Siren, MD  DATE OF DISCHARGE: 12/25/2017  PRIMARY CARE PHYSICIAN: Dortha Kern, MD    ADMISSION DIAGNOSIS:  Elevated troponin [R74.8] Fall, initial encounter [id number]] Syncope, unspecified syncope type [R55]  DISCHARGE DIAGNOSIS:  Elevated troponin---family (sister and HCPOA) does not want further workup Syncope/fall SECONDARY DIAGNOSIS:   Past Medical History:  Diagnosis Date  . CHF (congestive heart failure) (HCC)   . Heart murmur     HOSPITAL COURSE:   77 year old male with past medical history of mental retardation, hypertension, BPH who presents to the hospital after a syncopal episode/fall and noted to have a elevated troponin.  1. Syncope/seizure-it's unclear what exactly happened. Patient presented to the hospital yesterday with a unwitnessed fall and had negative trauma workup. He was sent back to his assisted living and now presents today with a suspected syncopal episode and possible seizure. He has had no seizure type activity while in the ER. -Repeat CT head is negative for acute pathology. -pt has no cp.  -spoke at length with sister Ms Su Hilt [facility]) and she would like no further testing at present. They know about his weak heart. -cancel echo.  2. Elevated troponin-suspect this is supply demand ischemia from his recent traumatic fall.  3. Status post fall/syncope-we will get a physical therapy consult to assess his mobility.  4. Essential hypertension-continue Toprol.  5. BPH-no urinary retention. Continue finasteride, Flomax.  6. History of CHF-clinically patient is not in congestive heart failure. Continue Lasix, Toprol. EF 28% by myoview in the past  Overall at baseline Will d/c back to [facility] per sister's request. Pt is  hemodynamically stable. He is DNR  CONSULTS OBTAINED:    DRUG ALLERGIES:  No Known Allergies  DISCHARGE MEDICATIONS:   Allergies as of 12/25/2017   No Known Allergies     Medication List    TAKE these medications   aspirin EC 81 MG tablet Take 81 mg by mouth daily.   D3-1000 1000 units tablet Generic drug:  Cholecalciferol Take 1,000 mg by mouth daily.   finasteride 5 MG tablet Commonly known as:  PROSCAR Take 5 mg by mouth daily.   furosemide 40 MG tablet Commonly known as:  LASIX Take 40 mg by mouth daily.   loperamide 2 MG capsule Commonly known as:  IMODIUM Take 2 mg by mouth as needed for diarrhea or loose stools.   magnesium oxide 400 MG tablet Commonly known as:  MAG-OX Take 800 mg by mouth daily with lunch.   metoprolol succinate 25 MG 24 hr tablet Commonly known as:  TOPROL-XL Take 12.5 mg by mouth daily.   tamsulosin 0.4 MG Caps capsule Commonly known as:  FLOMAX Take 0.8 mg by mouth at bedtime.       If you experience worsening of your admission symptoms, develop shortness of breath, life threatening emergency, suicidal or homicidal thoughts you must seek medical attention immediately by calling 911 or calling your MD immediately  if symptoms less severe.  You Must read complete instructions/literature along with all the possible adverse reactions/side effects for all the Medicines you take and that have been prescribed to you. Take any new Medicines after you have completely understood and accept all the possible adverse reactions/side effects.   Please note  You were cared for by a hospitalist  during your hospital stay. If you have any questions about your discharge medications or the care you received while you were in the hospital after you are discharged, you can call the unit and asked to speak with the hospitalist on call if the hospitalist that took care of you is not available. Once you are discharged, your primary care physician will  handle any further medical issues. Please note that NO REFILLS for any discharge medications will be authorized once you are discharged, as it is imperative that you return to your primary care physician (or establish a relationship with a primary care physician if you do not have one) for your aftercare needs so that they can reassess your need for medications and monitor your lab values. Today   SUBJECTIVE   No new complaints  VITAL SIGNS:  Blood pressure 108/76, pulse 96, temperature 97.9 F (36.6 C), temperature source Oral, resp. rate 18, height 5\' 6"  (1.676 m), weight 54.4 kg (120 lb), SpO2 98 %.  I/O:    Intake/Output Summary (Last 24 hours) at 12/25/2017 1011 Last data filed at 12/25/2017 0933 Gross per 24 hour  Intake 720 ml  Output -  Net 720 ml    PHYSICAL EXAMINATION:  GENERAL:  77 y.o.-year-old patient lying in the bed with no acute distress.  EYES: Pupils equal, round, reactive to light and accommodation. No scleral icterus. Extraocular muscles intact.  HEENT: Head atraumatic, normocephalic. Oropharynx and nasopharynx clear. Left Black eye NECK:  Supple, no jugular venous distention. No thyroid enlargement, no tenderness.  LUNGS: Normal breath sounds bilaterally, no wheezing, rales,rhonchi or crepitation. No use of accessory muscles of respiration.  CARDIOVASCULAR: S1, S2 normal. No murmurs, rubs, or gallops.  ABDOMEN: Soft, non-tender, non-distended. Bowel sounds present. No organomegaly or mass.  EXTREMITIES: No pedal edema, cyanosis, or clubbing.  NEUROLOGIC: Cranial nerves II through XII are intact. Muscle strength 5/5 in all extremities. Sensation intact. Gait not checked.  PSYCHIATRIC: The patient is alert and oriented x 3.  SKIN: No obvious rash, lesion, or ulcer.   DATA REVIEW:   CBC  Recent Labs  Lab 12/25/17 0659  WBC 6.7  HGB 12.9*  HCT 37.6*  PLT 238    Chemistries  Recent Labs  Lab 12/25/17 0659  NA 142  K 3.7  CL 107  CO2 28  GLUCOSE  101*  BUN 24*  CREATININE 1.26*  CALCIUM 9.3    Microbiology Results   No results found for this or any previous visit (from the past 240 hour(s)).  RADIOLOGY:  Ct Head Wo Contrast  Result Date: 12/24/2017 CLINICAL DATA:  Left orbital bruising and swelling after fall. EXAM: CT HEAD WITHOUT CONTRAST TECHNIQUE: Contiguous axial images were obtained from the base of the skull through the vertex without intravenous contrast. COMPARISON:  December 23, 2017 FINDINGS: Brain: No subdural, epidural, or subarachnoid hemorrhage. Ventricles and sulci are prominent but stable. Cerebellum, brainstem, and basal cisterns are normal. No mass effect or midline shift. No acute cortical ischemia or infarct. Vascular: No hyperdense vessel or unexpected calcification. Skull: Normal. Negative for fracture or focal lesion. Sinuses/Orbits: Mucosal thickening involves the ethmoid sinuses. No air-fluid levels. Mastoid air cells and middle ears are well aerated. Other: There is a small hematoma in the left periorbital region. The underlying globe is not completely imaged but grossly unremarkable. IMPRESSION: 1. No acute intracranial abnormality. 2. Small hematoma in the soft tissues over the left eye. The underlying globe is not completely imaged but there are no abnormalities  in the underlying globe within visualize limits. Electronically Signed   By: Gerome Sam III M.D   On: 12/24/2017 12:27   Ct Head Wo Contrast  Result Date: 12/23/2017 CLINICAL DATA:  Contusion about both eyes status post fall. EXAM: CT HEAD WITHOUT CONTRAST CT MAXILLOFACIAL WITHOUT CONTRAST CT CERVICAL SPINE WITHOUT CONTRAST TECHNIQUE: Multidetector CT imaging of the head, cervical spine, and maxillofacial structures were performed using the standard protocol without intravenous contrast. Multiplanar CT image reconstructions of the cervical spine and maxillofacial structures were also generated. COMPARISON:  None. FINDINGS: CT HEAD FINDINGS BRAIN:  There is mild sulcal and mild to moderate ventricular prominence consistent with superficial and central atrophy. No intraparenchymal hemorrhage, mass effect nor midline shift. Periventricular and subcortical white matter hypodensities consistent with chronic small vessel ischemic disease are identified. No acute large vascular territory infarcts. No abnormal extra-axial fluid collections. Basal cisterns are not effaced and midline. VASCULAR: Mild calcific atherosclerosis of the carotid siphons. No hyperdense vessels. SKULL: No skull fracture. No significant scalp soft tissue swelling. SINUSES/ORBITS: The mastoid air-cells are clear. The included paranasal sinuses are well-aerated.Moderate left supraorbital and periorbital soft tissue swelling is seen. No retrobulbar hemorrhage or hematoma. OTHER: None. CT MAXILLOFACIAL FINDINGS Osseous:   No acute maxillofacial fracture. Orbits: Intact orbits and globes. No retrobulbar hemorrhage or hematoma. Sinuses: Ethmoid and frontal sinus mucosal thickening. Soft tissues: Left greater than right supraorbital soft tissue swelling. CT CERVICAL SPINE FINDINGS Alignment: Normal. Skull base and vertebrae: No acute fracture. No primary bone lesion or focal pathologic process. Soft tissues and spinal canal: No prevertebral fluid or swelling. No visible canal hematoma. Disc levels: No significant disc space narrowing. Mild central disc bulges C2 through C7. No focal disc herniation, significant central canal stenosis or significant neural foraminal encroachment. No jumped or perched facets. Upper chest: Mild interstitial thickening suspicious for interstitial edema. Other: Bilateral mild extracranial carotid arteriosclerosis. IMPRESSION: 1. Atrophy with chronic small vessel ischemic disease. No acute intracranial abnormality. 2. Supraorbital soft tissue swelling left greater than right without acute maxillofacial fracture. 3. No acute cervical spine fracture or posttraumatic  listhesis. 4. Mild interstitial prominence suspicious for interstitial edema. Electronically Signed   By: Tollie Eth M.D.   On: 12/23/2017 23:25   Ct Cervical Spine Wo Contrast  Result Date: 12/23/2017 CLINICAL DATA:  Contusion about both eyes status post fall. EXAM: CT HEAD WITHOUT CONTRAST CT MAXILLOFACIAL WITHOUT CONTRAST CT CERVICAL SPINE WITHOUT CONTRAST TECHNIQUE: Multidetector CT imaging of the head, cervical spine, and maxillofacial structures were performed using the standard protocol without intravenous contrast. Multiplanar CT image reconstructions of the cervical spine and maxillofacial structures were also generated. COMPARISON:  None. FINDINGS: CT HEAD FINDINGS BRAIN: There is mild sulcal and mild to moderate ventricular prominence consistent with superficial and central atrophy. No intraparenchymal hemorrhage, mass effect nor midline shift. Periventricular and subcortical white matter hypodensities consistent with chronic small vessel ischemic disease are identified. No acute large vascular territory infarcts. No abnormal extra-axial fluid collections. Basal cisterns are not effaced and midline. VASCULAR: Mild calcific atherosclerosis of the carotid siphons. No hyperdense vessels. SKULL: No skull fracture. No significant scalp soft tissue swelling. SINUSES/ORBITS: The mastoid air-cells are clear. The included paranasal sinuses are well-aerated.Moderate left supraorbital and periorbital soft tissue swelling is seen. No retrobulbar hemorrhage or hematoma. OTHER: None. CT MAXILLOFACIAL FINDINGS Osseous:   No acute maxillofacial fracture. Orbits: Intact orbits and globes. No retrobulbar hemorrhage or hematoma. Sinuses: Ethmoid and frontal sinus mucosal thickening. Soft tissues: Left greater than right  supraorbital soft tissue swelling. CT CERVICAL SPINE FINDINGS Alignment: Normal. Skull base and vertebrae: No acute fracture. No primary bone lesion or focal pathologic process. Soft tissues and spinal  canal: No prevertebral fluid or swelling. No visible canal hematoma. Disc levels: No significant disc space narrowing. Mild central disc bulges C2 through C7. No focal disc herniation, significant central canal stenosis or significant neural foraminal encroachment. No jumped or perched facets. Upper chest: Mild interstitial thickening suspicious for interstitial edema. Other: Bilateral mild extracranial carotid arteriosclerosis. IMPRESSION: 1. Atrophy with chronic small vessel ischemic disease. No acute intracranial abnormality. 2. Supraorbital soft tissue swelling left greater than right without acute maxillofacial fracture. 3. No acute cervical spine fracture or posttraumatic listhesis. 4. Mild interstitial prominence suspicious for interstitial edema. Electronically Signed   By: Tollie Ethavid  Kwon M.D.   On: 12/23/2017 23:25   Ct Maxillofacial Wo Contrast  Result Date: 12/23/2017 CLINICAL DATA:  Contusion about both eyes status post fall. EXAM: CT HEAD WITHOUT CONTRAST CT MAXILLOFACIAL WITHOUT CONTRAST CT CERVICAL SPINE WITHOUT CONTRAST TECHNIQUE: Multidetector CT imaging of the head, cervical spine, and maxillofacial structures were performed using the standard protocol without intravenous contrast. Multiplanar CT image reconstructions of the cervical spine and maxillofacial structures were also generated. COMPARISON:  None. FINDINGS: CT HEAD FINDINGS BRAIN: There is mild sulcal and mild to moderate ventricular prominence consistent with superficial and central atrophy. No intraparenchymal hemorrhage, mass effect nor midline shift. Periventricular and subcortical white matter hypodensities consistent with chronic small vessel ischemic disease are identified. No acute large vascular territory infarcts. No abnormal extra-axial fluid collections. Basal cisterns are not effaced and midline. VASCULAR: Mild calcific atherosclerosis of the carotid siphons. No hyperdense vessels. SKULL: No skull fracture. No significant  scalp soft tissue swelling. SINUSES/ORBITS: The mastoid air-cells are clear. The included paranasal sinuses are well-aerated.Moderate left supraorbital and periorbital soft tissue swelling is seen. No retrobulbar hemorrhage or hematoma. OTHER: None. CT MAXILLOFACIAL FINDINGS Osseous:   No acute maxillofacial fracture. Orbits: Intact orbits and globes. No retrobulbar hemorrhage or hematoma. Sinuses: Ethmoid and frontal sinus mucosal thickening. Soft tissues: Left greater than right supraorbital soft tissue swelling. CT CERVICAL SPINE FINDINGS Alignment: Normal. Skull base and vertebrae: No acute fracture. No primary bone lesion or focal pathologic process. Soft tissues and spinal canal: No prevertebral fluid or swelling. No visible canal hematoma. Disc levels: No significant disc space narrowing. Mild central disc bulges C2 through C7. No focal disc herniation, significant central canal stenosis or significant neural foraminal encroachment. No jumped or perched facets. Upper chest: Mild interstitial thickening suspicious for interstitial edema. Other: Bilateral mild extracranial carotid arteriosclerosis. IMPRESSION: 1. Atrophy with chronic small vessel ischemic disease. No acute intracranial abnormality. 2. Supraorbital soft tissue swelling left greater than right without acute maxillofacial fracture. 3. No acute cervical spine fracture or posttraumatic listhesis. 4. Mild interstitial prominence suspicious for interstitial edema. Electronically Signed   By: Tollie Ethavid  Kwon M.D.   On: 12/23/2017 23:25     Management plans discussed with the patient, family and they are in agreement.  CODE STATUS:     Code Status Orders  (From admission, onward)        Start     Ordered   12/24/17 1451  Do not attempt resuscitation (DNR)  Continuous    Question Answer Comment  In the event of cardiac or respiratory ARREST Do not call a "code blue"   In the event of cardiac or respiratory ARREST Do not perform Intubation,  CPR, defibrillation or ACLS  In the event of cardiac or respiratory ARREST Use medication by any route, position, wound care, and other measures to relive pain and suffering. May use oxygen, suction and manual treatment of airway obstruction as needed for comfort.      12/24/17 1450    Code Status History    Date Active Date Inactive Code Status Order ID Comments User Context   This patient has a current code status but no historical code status.    Advance Directive Documentation     Most Recent Value  Type of Advance Directive  Out of facility DNR (pink MOST or yellow form), Healthcare Power of Attorney  Pre-existing out of facility DNR order (yellow form or pink MOST form)  Yellow form placed in chart (order not valid for inpatient use)  "MOST" Form in Place?  No data      TOTAL TIME TAKING CARE OF THIS PATIENT: *40* minutes.    Enedina Finner M.D on 12/25/2017 at 10:11 AM  Between 7am to 6pm - Pager - [phone] After 6pm go to [email or web address] - password [email or web address]  [facility]  Office  [phone]  CC: Primary care physician; Dortha Kern, MD

## 2017-12-25 NOTE — NC FL2 (Signed)
[place] MEDICAID FL2 LEVEL OF CARE SCREENING TOOL     IDENTIFICATION  Patient Name: Carl Jackson Birthdate: Aug 28, 1941 Sex: male Admission Date (Current Location): 12/24/2017  [place] and [place] Number:  [address] [id number] [address] Facility and Address:  [facility], [address] [place], [place] [address]      Provider Number: [id number]  Attending Physician Name and Address:  Enedina Finner, MD  Relative Name and Phone Number:  Gennette Pac (HCPOA/sister) [phone]    Current Level of Care: Hospital Recommended Level of Care: Skilled Nursing Facility Prior Approval Number:    Date Approved/Denied:   PASRR Number: [id number]  Discharge Plan: SNF    Current Diagnoses: Patient Active Problem List   Diagnosis Date Noted  . Elevated troponin 12/24/2017    Orientation RESPIRATION BLADDER Height & Weight     Self, Situation, Place  Normal Continent Weight: 120 lb (54.4 kg) Height:  5\' 6"  (167.6 cm)  BEHAVIORAL SYMPTOMS/MOOD NEUROLOGICAL BOWEL NUTRITION STATUS      Continent Diet(Heart Healthy)  AMBULATORY STATUS COMMUNICATION OF NEEDS Skin   Extensive Assist Verbally Bruising                       Personal Care Assistance Level of Assistance  Bathing, Feeding, Dressing Bathing Assistance: Limited assistance Feeding assistance: Independent Dressing Assistance: Limited assistance     Functional Limitations Info             SPECIAL CARE FACTORS FREQUENCY  PT (By licensed PT)     PT Frequency: Resume previous order              Contractures Contractures Info: Not present    Additional Factors Info  Code Status, Allergies Code Status Info: DNR Allergies Info: No known allergies           Current Medications (12/25/2017):  This is the current hospital active medication list Current Facility-Administered Medications  Medication Dose Route Frequency Provider Last Rate Last Dose  . acetaminophen (TYLENOL) tablet  650 mg  650 mg Oral Q6H PRN Houston Siren, MD       Or  . acetaminophen (TYLENOL) suppository 650 mg  650 mg Rectal Q6H PRN Houston Siren, MD      . aspirin EC tablet 81 mg  81 mg Oral Daily Houston Siren, MD   81 mg at 12/25/17 0956  . cholecalciferol (VITAMIN D) tablet   Oral Daily Houston Siren, MD   1,000 Units at 12/25/17 0956  . enoxaparin (LOVENOX) injection 40 mg  40 mg Subcutaneous Q24H Houston Siren, MD   40 mg at 12/24/17 2102  . finasteride (PROSCAR) tablet 5 mg  5 mg Oral Daily Houston Siren, MD   5 mg at 12/25/17 0956  . furosemide (LASIX) tablet 40 mg  40 mg Oral Daily Houston Siren, MD   40 mg at 12/25/17 0956  . metoprolol succinate (TOPROL-XL) 24 hr tablet 12.5 mg  12.5 mg Oral Daily Houston Siren, MD   12.5 mg at 12/25/17 0956  . ondansetron (ZOFRAN) tablet 4 mg  4 mg Oral Q6H PRN Houston Siren, MD       Or  . ondansetron (ZOFRAN) injection 4 mg  4 mg Intravenous Q6H PRN Houston Siren, MD      . tamsulosin (FLOMAX) capsule 0.8 mg  0.8 mg Oral QHS Houston Siren, MD   0.8 mg at 12/24/17 2102     Discharge Medications:  Please see discharge summary for a list of discharge medications.  Relevant Imaging Results:  Relevant Lab Results:   Additional Information SS#[id number]  Judi CongKaren M Ajani Rineer, LCSW

## 2017-12-25 NOTE — Progress Notes (Signed)
Pt discharged back to [facility] provided transport. Report claled to facility. Sister at bedside ad all questions answered.

## 2017-12-25 NOTE — Discharge Instructions (Signed)
Syncope Syncope is when you lose temporarily pass out (faint). Signs that you may be about to pass out include:  Feeling dizzy or light-headed.  Feeling sick to your stomach (nauseous).  Seeing all white or all black.  Having cold, clammy skin.  If you passed out, get help right away. Call your local emergency services (911 in the [place] Do not drive yourself to the hospital. Follow these instructions at home: Pay attention to any changes in your symptoms. Take these actions to help with your condition:  Have someone stay with you until you feel stable.  Do not drive, use machinery, or play sports until your doctor says it is okay.  Keep all follow-up visits as told by your doctor. This is important.  If you start to feel like you might pass out, lie down right away and raise (elevate) your feet above the level of your heart. Breathe deeply and steadily. Wait until all of the symptoms are gone.  Drink enough fluid to keep your pee (urine) clear or pale yellow.  If you are taking blood pressure or heart medicine, get up slowly and spend many minutes getting ready to sit and then stand. This can help with dizziness.  Take over-the-counter and prescription medicines only as told by your doctor.  Get help right away if:  You have a very bad headache.  You have unusual pain in your chest, tummy, or back.  You are bleeding from your mouth or rectum.  You have black or tarry poop (stool).  You have a very fast or uneven heartbeat (palpitations).  It hurts to breathe.  You pass out once or more than once.  You have jerky movements that you cannot control (seizure).  You are confused.  You have trouble walking.  You are very weak.  You have vision problems. These symptoms may be an emergency. Do not wait to see if the symptoms will go away. Get medical help right away. Call your local emergency services (911 in the [place] Do not drive yourself to the hospital. This  information is not intended to replace advice given to you by your health care provider. Make sure you discuss any questions you have with your health care provider. Document Released: 05/03/2008 Document Revised: 04/22/2016 Document Reviewed: 07/30/2015 Elsevier Interactive Patient Education  2018 [employer]  

## 2017-12-25 NOTE — Clinical Social Work Note (Signed)
Clinical Social Work Assessment  Patient Details  Name: Carl Jackson MRN: [id number] Date of Birth: 01-16-1941  Date of referral:  12/25/17               Reason for consult:  Facility Placement                Permission sought to share information with:  Facility Medical sales representativeContact Representative Permission granted to share information::  Yes, Verbal Permission Granted  Name::        Agency::  [facility]  Relationship::  SNF  Contact Information:  [phone]  Housing/Transportation Living arrangements for the past 2 months:  Skilled Nursing Facility Source of Information:  Power of Attorney, Facility, Medical Team Patient Interpreter Needed:  None Criminal Activity/Legal Involvement Pertinent to Current Situation/Hospitalization:  No - Comment as needed Significant Relationships:  Merchandiser, retailCommunity Support, Warehouse managerChurch, Siblings Lives with:  Facility Resident Do you feel safe going back to the place where you live?  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 The patient and Carl RalphsVivian gave permission to contact [facility] to inquire about imminent discharge. [facility] confirmed that the patient can return today as he does not need to be readmitted. CSW informed the HCPOA and attending MD.  CSW will prepare the discharge packet and send all needed documentation to the facility as available. The patient with transport via non-emergent EMS per his HCPOA. The CSW will sign off once the packet has been delivered to the attending nurse.  Employment status:  Retired Database administratornsurance information:  Managed Medicare, [place] PT Recommendations:  Not assessed at this time Information / Referral to community resources:      Patient/Family's Response to care:  The family thanked the CSW for assistance.  Patient/Family's Understanding of and Emotional Response to Diagnosis, Current Treatment, and Prognosis: The patient and family understand and agree with the discharge plan [facility].  Emotional Assessment Appearance:  Appears stated age Attitude/Demeanor/Rapport:  Engaged Affect (typically observed):  Appropriate, Pleasant Orientation:  Oriented to Self, Oriented to Place, Oriented to Situation Alcohol / Substance use:  Never Used Psych involvement (Current and /or in the community):  No (Comment)  Discharge Needs  Concerns to be addressed:  Care Coordination, Discharge Planning Concerns Readmission within the last 30 days:  No Current discharge risk:  None Barriers to Discharge:  No Barriers Identified   Carl CongKaren M Ria Redcay, LCSW 12/25/2017, 10:12 AM

## 2017-12-30 DEATH — deceased

## 2018-08-07 IMAGING — CT CT HEAD W/O CM
3 of 4 series · 15 of 47 positions shown, 18 images · non-contrast
Comparison: December 23, 2017

CLINICAL DATA: Left orbital bruising and swelling after fall.

EXAM:
CT HEAD WITHOUT CONTRAST
TECHNIQUE: Contiguous axial images were obtained from the base of the skull
through the vertex without intravenous contrast.

[Series 3: head wo · axial · 0.47mm/px · z∈[-178,-58]mm · 9 of 28 slices shown, 12 images]
[im 2/28  brain]
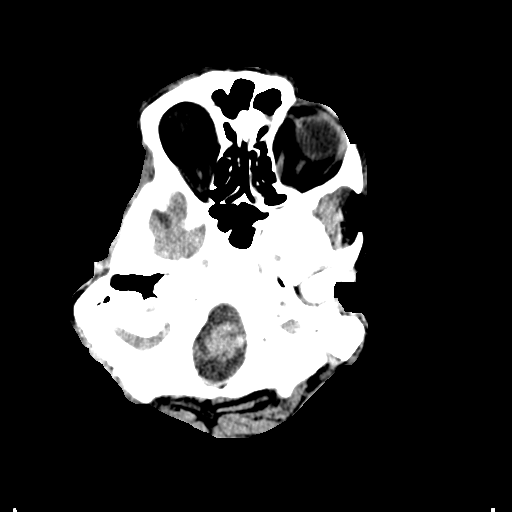
[im 2/28  bone]
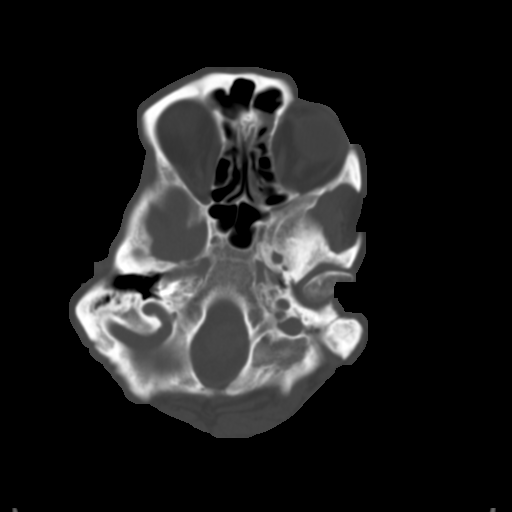
[im 6/28  brain]
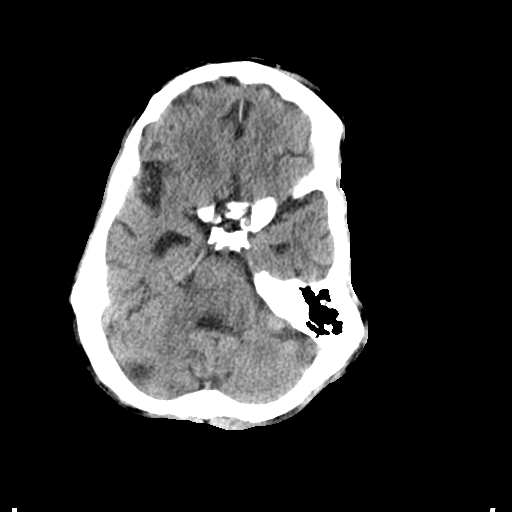
[im 8/28  brain]
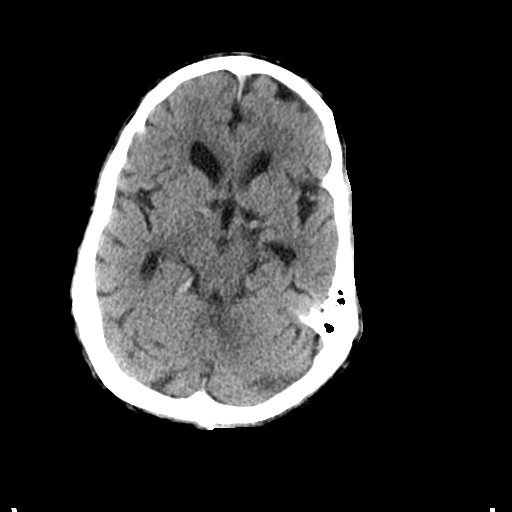
[im 12/28  brain]
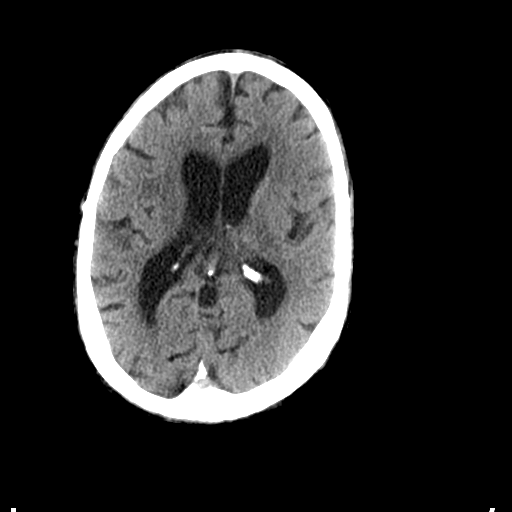
[im 14/28  brain]
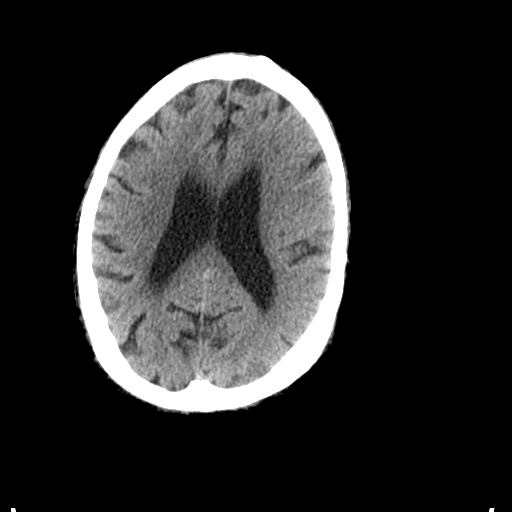
[im 14/28  bone]
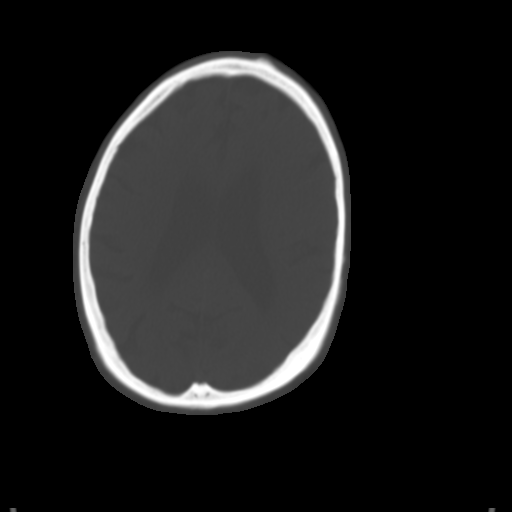
[im 16/28  brain]
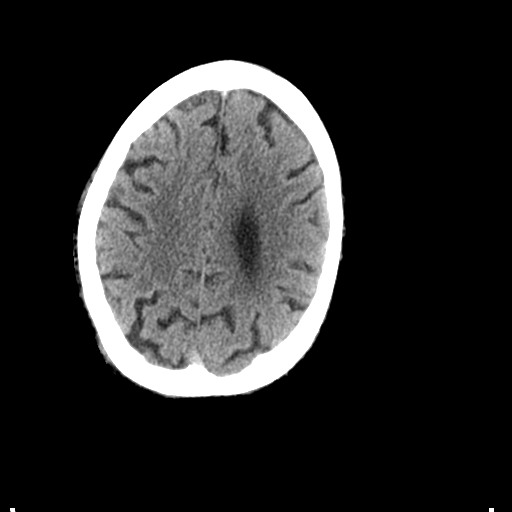
[im 20/28  brain]
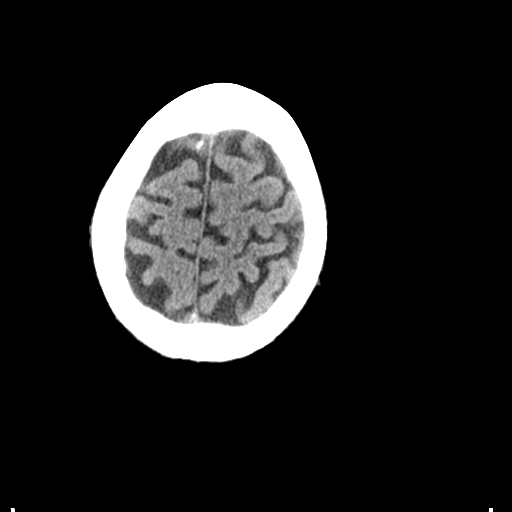
[im 22/28  brain]
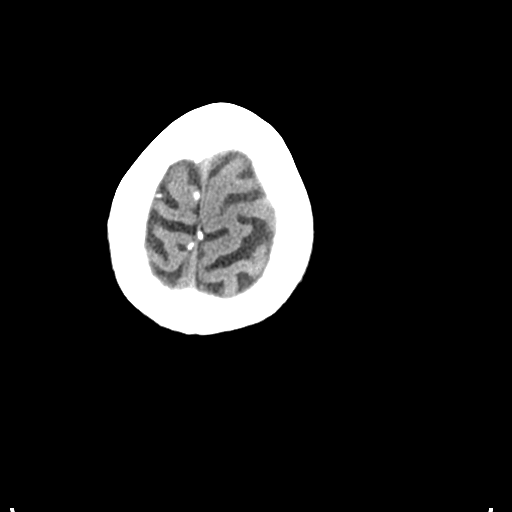
[im 26/28  brain]
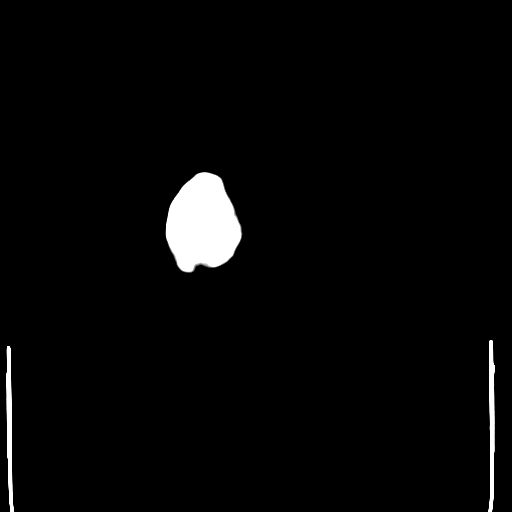
[im 26/28  bone]
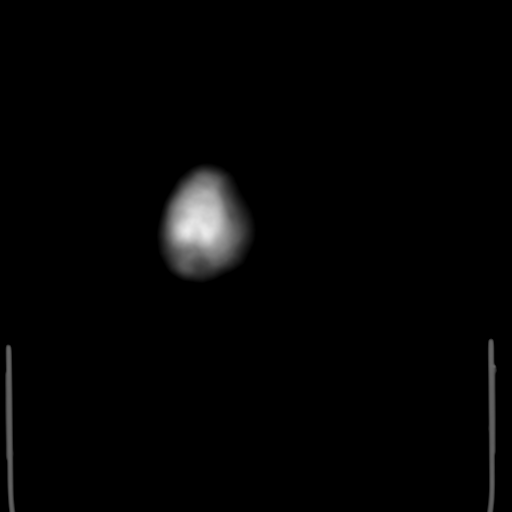

[Series 6: coronal soft tissue · coronal · 0.26mm/px · 3 of 61 slices shown]
[im 21/61  brain]
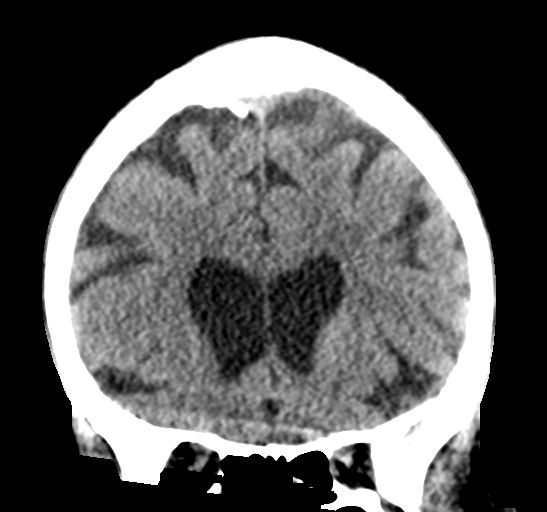
[im 27/61  brain]
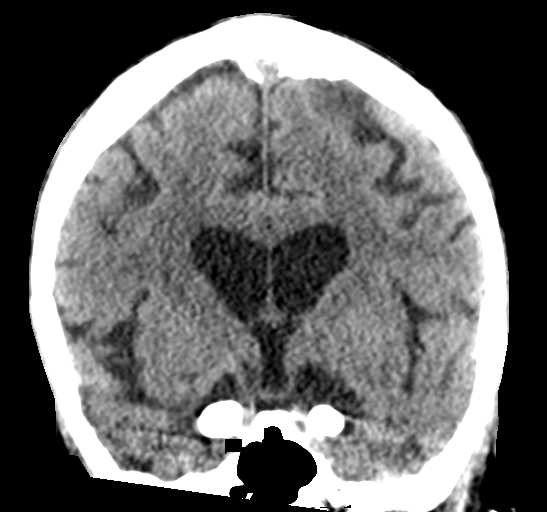
[im 34/61  brain]
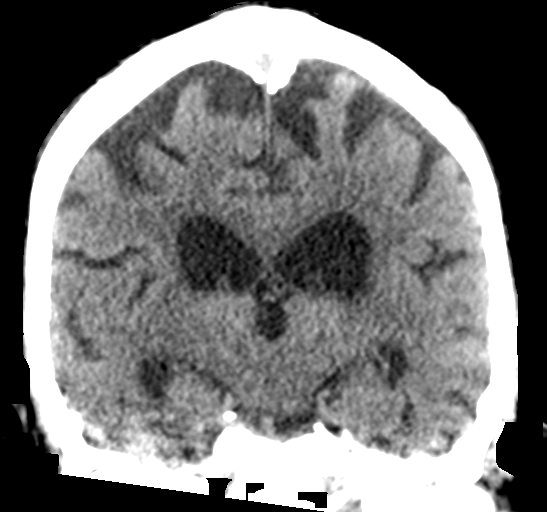

[Series 7: sagittal soft tissue · sagittal · 0.28mm/px · 3 of 48 slices shown]
[im 18/48  brain]
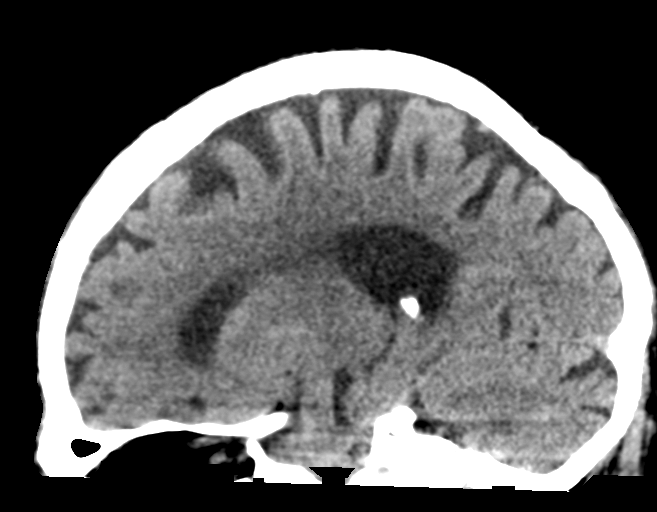
[im 24/48  brain]
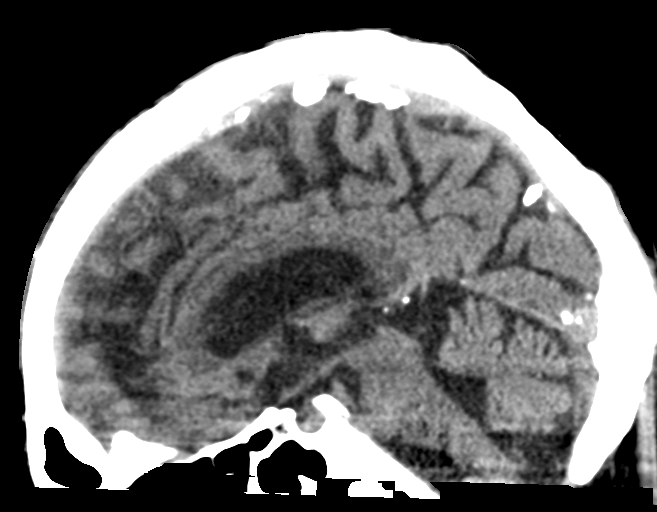
[im 30/48  brain]
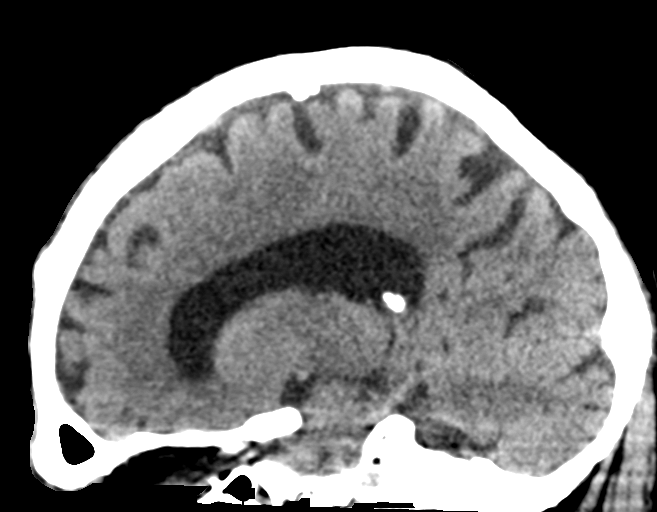

[15 of 47 positions shown; findings below may reference images not displayed]

FINDINGS: Brain: No subdural, epidural, or subarachnoid hemorrhage. Ventricles
and sulci are prominent but stable. Cerebellum, brainstem, and basal
cisterns are normal. No mass effect or midline shift. No acute
cortical ischemia or infarct.

Vascular: No hyperdense vessel or unexpected calcification.

Skull: Normal. Negative for fracture or focal lesion.

Sinuses/Orbits: Mucosal thickening involves the ethmoid sinuses. No
air-fluid levels. Mastoid air cells and middle ears are well
aerated.

Other: There is a small hematoma in the left periorbital region. The
underlying globe is not completely imaged but grossly unremarkable.
IMPRESSION: 1. No acute intracranial abnormality.
2. Small hematoma in the soft tissues over the left eye. The
underlying globe is not completely imaged but there are no
abnormalities in the underlying globe within visualize limits.
